# Patient Record
Sex: Female | Born: 1995 | Race: Asian | Hispanic: No | Marital: Single | Smoking: Never smoker
Health system: Southern US, Community
[De-identification: ages and names within clinical notes are randomized; demographics above are authoritative.]

## PROBLEM LIST (undated history)

## (undated) DIAGNOSIS — F419 Anxiety disorder, unspecified: Secondary | ICD-10-CM

## (undated) DIAGNOSIS — R718 Other abnormality of red blood cells: Secondary | ICD-10-CM

## (undated) DIAGNOSIS — D649 Anemia, unspecified: Secondary | ICD-10-CM

## (undated) DIAGNOSIS — F329 Major depressive disorder, single episode, unspecified: Secondary | ICD-10-CM

## (undated) DIAGNOSIS — F32A Depression, unspecified: Secondary | ICD-10-CM

## (undated) HISTORY — PX: CHALAZION EXCISION: SHX213

## (undated) HISTORY — DX: Depression, unspecified: F32.A

## (undated) HISTORY — DX: Anxiety disorder, unspecified: F41.9

## (undated) HISTORY — DX: Other abnormality of red blood cells: R71.8

## (undated) HISTORY — DX: Anemia, unspecified: D64.9

---
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
## 2015-06-15 ENCOUNTER — Emergency Department (HOSPITAL_COMMUNITY)
Admission: EM | Admit: 2015-06-15 | Discharge: 2015-06-15 | Disposition: A | Discharge status: Home / Self Care | Payer: BLUE CROSS/BLUE SHIELD | Attending: Emergency Medicine | Admitting: Emergency Medicine

## 2015-06-15 ENCOUNTER — Encounter (HOSPITAL_COMMUNITY): Payer: Self-pay | Admitting: *Deleted

## 2015-06-15 ENCOUNTER — Emergency Department (HOSPITAL_COMMUNITY): Payer: BLUE CROSS/BLUE SHIELD

## 2015-06-15 DIAGNOSIS — F419 Anxiety disorder, unspecified: Secondary | ICD-10-CM | POA: Insufficient documentation

## 2015-06-15 DIAGNOSIS — R0602 Shortness of breath: Secondary | ICD-10-CM | POA: Diagnosis not present

## 2015-06-15 DIAGNOSIS — Z3202 Encounter for pregnancy test, result negative: Secondary | ICD-10-CM | POA: Insufficient documentation

## 2015-06-15 DIAGNOSIS — R079 Chest pain, unspecified: Secondary | ICD-10-CM | POA: Diagnosis present

## 2015-06-15 LAB — BASIC METABOLIC PANEL
Anion gap: 11 (ref 5–15)
BUN: 10 mg/dL (ref 6–20)
CALCIUM: 9.4 mg/dL (ref 8.9–10.3)
CHLORIDE: 107 mmol/L (ref 101–111)
CO2: 20 mmol/L — ABNORMAL LOW (ref 22–32)
Creatinine, Ser: 0.74 mg/dL (ref 0.44–1.00)
GFR calc Af Amer: >60 mL/min (ref >60)
GFR calc non Af Amer: >60 mL/min (ref >60)
Glucose, Bld: 74 mg/dL (ref 65–99)
Potassium: 3.3 mmol/L — ABNORMAL LOW (ref 3.5–5.1)
Sodium: 138 mmol/L (ref 135–145)

## 2015-06-15 LAB — CBC
HCT: 34.6 % — ABNORMAL LOW (ref 36.0–46.0)
Hemoglobin: 11.1 g/dL — ABNORMAL LOW (ref 12.0–15.0)
MCH: 21.3 pg — ABNORMAL LOW (ref 26.0–34.0)
MCHC: 32.1 g/dL (ref 30.0–36.0)
MCV: 66.4 fL — ABNORMAL LOW (ref 78.0–100.0)
PLATELETS: 141 10*3/uL — AB (ref 150–400)
RBC: 5.21 MIL/uL — ABNORMAL HIGH (ref 3.87–5.11)
RDW: 13.6 % (ref 11.5–15.5)
WBC: 5.2 10*3/uL (ref 4.0–10.5)

## 2015-06-15 LAB — I-STAT TROPONIN, ED: TROPONIN I, POC: 0 ng/mL (ref 0.00–0.08)

## 2015-06-15 LAB — POC URINE PREG, ED: Preg Test, Ur: NEGATIVE

## 2015-06-15 MED ORDER — IOHEXOL 350 MG/ML SOLN
80.0000 mL | Freq: Once | INTRAVENOUS | Status: AC | PRN
  Administered 2015-06-15: 100 mL via INTRAVENOUS

## 2015-06-15 NOTE — Discharge Instructions (Signed)

## 2015-06-15 NOTE — ED Provider Notes (Signed)
CSN: [id number]     Arrival date & time 06/15/15  1821 History   This chart was scribed for non-physician practitioner, [facility]. Damian LeavellNeese, NP, working with Benjiman CoreNathan Pickering, MD, by Budd PalmerVanessa Prueter ED Scribe. This patient was seen in room TR02C/TR02C and the patient's care was started at 8:40 PM    Chief Complaint  Patient presents with  . Chest Pain   Patient is a 19 y.o. female presenting with chest pain. The history is provided by the patient. No language interpreter was used.  Chest Pain Pain location:  L chest and R chest Pain quality: dull and throbbing   Pain radiates to:  Does not radiate Pain severity:  Moderate Onset quality:  Gradual Duration:  5 days Timing:  Constant Progression:  Worsening Chronicity:  New Context: stress   Relieved by:  Nothing Worsened by:  Nothing tried Ineffective treatments:  None tried Associated symptoms: shortness of breath   Risk factors: no smoking    HPI Comments: Debra Burke is a 19 y.o. female who presents to the Emergency Department complaining of dull, throbbing, chest pain onset 4-5 days ago. She reports associated SOB. She says it started on the left, then moved to the right and then to the entire chest 2 days ago. [facility] performed a D-Dimer test that came back elevated, so she was referred to the ED. She does not take any medications. She has been under a lot of stress at school, is working full time, and there has been a recent death of a family friend. She does not smoke. She is on an implant birth control. She denies a family history of DVT or PE.   History reviewed. No pertinent past medical history. History reviewed. No pertinent past surgical history. No family history on file. History  Substance Use Topics  . Smoking status: Never Smoker   . Smokeless tobacco: Not on file  . Alcohol Use: No   OB History    No data available     Review of Systems  Respiratory: Positive for shortness of  breath.   Cardiovascular: Positive for chest pain.  All other systems reviewed and are negative.   Allergies  Review of patient's allergies indicates no known allergies.  Home Medications   Prior to Admission medications   Not on File   BP 110/59 mmHg  Pulse 78  Temp(Src) 97.9 F (36.6 C) (Oral)  Resp 18  Ht 5\' 3"  (1.6 m)  Wt 112 lb (50.803 kg)  BMI 19.84 kg/m2  SpO2 100% Physical Exam  Constitutional: She is oriented to person, place, and time. She appears well-developed and well-nourished. No distress.  HENT:  Head: Normocephalic and atraumatic.  Mouth/Throat: Oropharynx is clear and moist.  Eyes: Conjunctivae and EOM are normal. Pupils are equal, round, and reactive to light.  Neck: Normal range of motion. Neck supple. Normal carotid pulses and no JVD present. No spinous process tenderness present. Carotid bruit is not present. No tracheal deviation present.  Cardiovascular: Normal rate, regular rhythm and normal heart sounds.   No distension of the JVD No bruits  Pulmonary/Chest: Breath sounds normal. No respiratory distress.  Lungs clear to auscultation  Abdominal: Soft.  Musculoskeletal: Normal range of motion.  Neurological: She is alert and oriented to person, place, and time.  Skin: Skin is warm and dry.  Psychiatric: She has a normal mood and affect. Her behavior is normal.  Nursing note and vitals reviewed.   ED  Course  Procedures  DIAGNOSTIC STUDIES: Oxygen Saturation is 99% on RA, normal by my interpretation.    COORDINATION OF CARE: 8:45 PM - Discussed plans to perform a chest CT. Pt advised of plan for treatment and pt agrees.  Labs Review Results for orders placed or performed during the hospital encounter of 06/15/15 (from the past 24 hour(s))  CBC     Status: Abnormal   Collection Time: 06/15/15  6:35 PM  Result Value Ref Range   WBC 5.2 4.0 - 10.5 K/uL   RBC 5.21 (H) 3.87 - 5.11 MIL/uL   Hemoglobin 11.1 (L) 12.0 - 15.0 g/dL   HCT 16.1 (L)  09.6 - 46.0 %   MCV 66.4 (L) 78.0 - 100.0 fL   MCH 21.3 (L) 26.0 - 34.0 pg   MCHC 32.1 30.0 - 36.0 g/dL   RDW 04.5 40.9 - 81.1 %   Platelets 141 (L) 150 - 400 K/uL  Basic metabolic panel     Status: Abnormal   Collection Time: 06/15/15  6:35 PM  Result Value Ref Range   Sodium 138 135 - 145 mmol/L   Potassium 3.3 (L) 3.5 - 5.1 mmol/L   Chloride 107 101 - 111 mmol/L   CO2 20 (L) 22 - 32 mmol/L   Glucose, Bld 74 65 - 99 mg/dL   BUN 10 6 - 20 mg/dL   Creatinine, Ser 9.14 0.44 - 1.00 mg/dL   Calcium 9.4 8.9 - 78.2 mg/dL   GFR calc non Af Amer >60 >60 mL/min   GFR calc Af Amer >60 >60 mL/min   Anion gap 11 5 - 15  I-stat troponin, ED  (not at [facility])     Status: None   Collection Time: 06/15/15  6:54 PM  Result Value Ref Range   Troponin i, poc 0.00 0.00 - 0.08 ng/mL   Comment 3          POC Urine Pregnancy, ED (pre-menopausal females)  not at [facility]     Status: None   Collection Time: 06/15/15  8:25 PM  Result Value Ref Range   Preg Test, Ur NEGATIVE NEGATIVE     Imaging Review Dg Chest 2 View  06/15/2015   CLINICAL DATA:  Chest pain for 3 days with shortness of breath.  EXAM: CHEST  2 VIEW  COMPARISON:  11/10/2005.  FINDINGS: Trachea is midline. Heart size normal. Lungs are hyperinflated but clear. No pleural fluid. Mild S-shaped scoliosis.  IMPRESSION: Hyperinflation without acute finding.   Electronically Signed   By: Leanna Battles M.D.   On: 06/15/2015 18:55   Ct Angio Chest Pe W/cm &/or Wo Cm  06/15/2015   CLINICAL DATA:  Chest pain and shortness of breath for 5 days.  EXAM: CT ANGIOGRAPHY CHEST WITH CONTRAST  TECHNIQUE: Multidetector CT imaging of the chest was performed using the standard protocol during bolus administration of intravenous contrast. Multiplanar CT image reconstructions and MIPs were obtained to evaluate the vascular anatomy.  CONTRAST:  OMNIPAQUE IOHEXOL 350 MG/ML SOLN  COMPARISON:  None.  FINDINGS: Mediastinum/Lymph Nodes: Satisfactory opacification of  pulmonary arteries is noted, and no pulmonary embolism identified. No evidence of thoracic aortic dissection or aneurysm.  No masses or pathologically enlarged lymph nodes identified.  Lungs/Pleura: No pulmonary infiltrate or mass identified. No effusion present.  Musculoskeletal/Soft Tissues: No suspicious bone lesions or other significant chest wall abnormality.  Upper Abdomen:  Unremarkable.  Review of the MIP images confirms the above findings.  IMPRESSION: Negative. No evidence  of pulmonary embolism or other active disease within the thorax.   Electronically Signed   By: Myles Rosenthal M.D.   On: 06/15/2015 22:10  ation:  Sinus tachycardia Rightward axis Nonspecific T wave abnormality Abnormal ECG Confirmed by Rubin Payor  MD, NATHAN [phone]) on 06/15/2015 8:48:46 PM   EKG Interpretation   Date/Time:  Wednesday June 15 2015 18:25:13 EDT Ventricular Rate:  108 PR Interval:  148 QRS Duration: 78 QT Interval:  322 QTC Calculation: 431 R Axis:   95 Text Interpretation:  Sinus tachycardia Rightward axis Nonspecific T wave  abnormality Abnormal ECG Confirmed by Rubin Payor  MD, Harrold Donath [phone]) on  06/15/2015 8:48:46 PM     I discussed this case with Dr. Rubin Payor and reviewed the EKG with him.  MDM  19 y.o. female with chest pain x 4 days and feeling stressed due to work, school and death of a friend. Stable for d/c and currently feeling better without pain or felling anxious. She will return to her doctor for follow up. She will return here as needed. Discussed with the patient clinical CT, x-ray and lab findings and all questioned fully answered.  Final diagnoses:  Chest pain  Anxiety   I personally performed the services described in this documentation, which was scribed in my presence. The recorded information has been reviewed and is accurate.   Willis Wharf, NP 06/16/15 [phone]  Benjiman Core, MD 06/18/15 [phone]

## 2015-06-15 NOTE — ED Notes (Signed)
Pt c/o generalized CP x 4 days.

## 2015-06-15 NOTE — ED Notes (Addendum)
Pt verbalizes understanding of d/c instructions and denies any further needs at this time. 

## 2016-06-04 IMAGING — CT CT ANGIO CHEST
2 of 10 series · 15 of 36 positions shown · IV contrast (Omni 300)
Comparison: None.

CLINICAL DATA: Chest pain and shortness of breath for 5 days.

EXAM:
CT ANGIOGRAPHY CHEST WITH CONTRAST
TECHNIQUE: Multidetector CT imaging of the chest was performed using the
standard protocol during bolus administration of intravenous
contrast. Multiplanar CT image reconstructions and MIPs were
obtained to evaluate the vascular anatomy.
CONTRAST:  100mL OMNIPAQUE IOHEXOL 350 MG/ML SOLN

[Series 8: coronal mpr · coronal · 0.49mm/px · 1 of 88 slices shown]
[im 44/88  mediastinal]
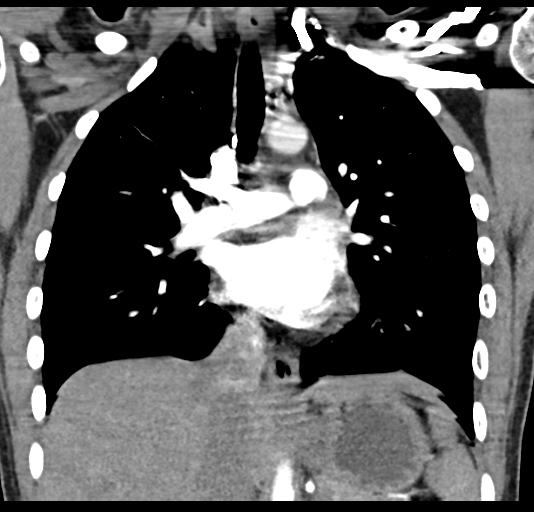

[Series 15: pe 1.0 i30f 3 · axial · 0.53mm/px · z∈[-694,-485]mm · 14 of 243 slices shown]
[im 17/243  lung]
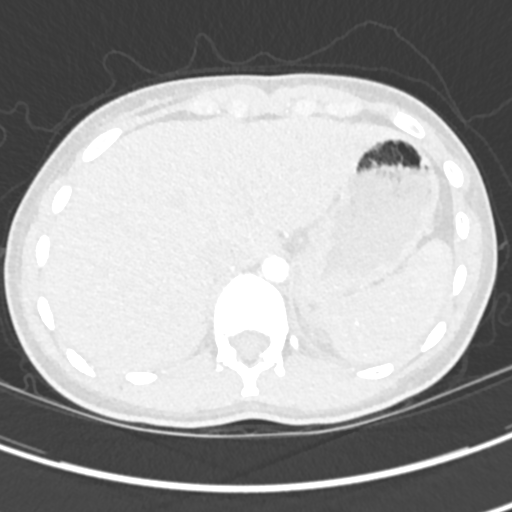
[im 33/243  mediastinal]
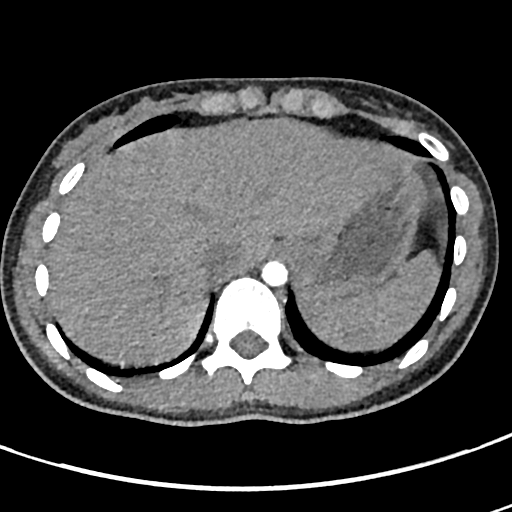
[im 49/243  lung]
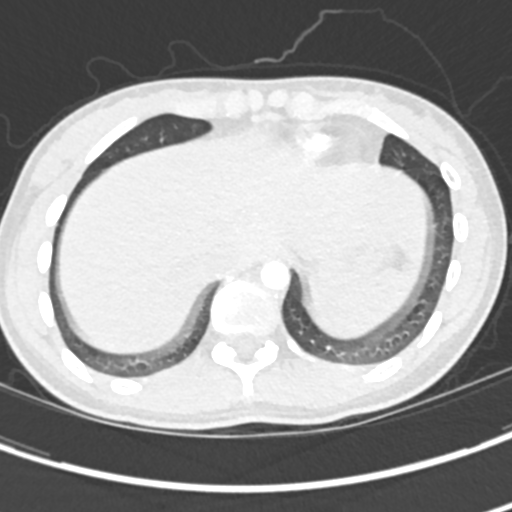
[im 65/243  mediastinal]
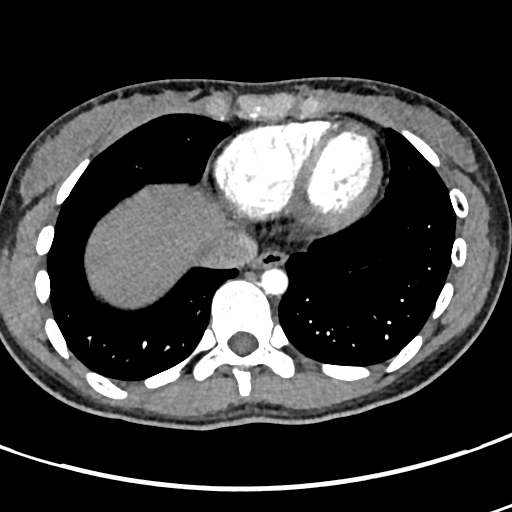
[im 81/243  lung]
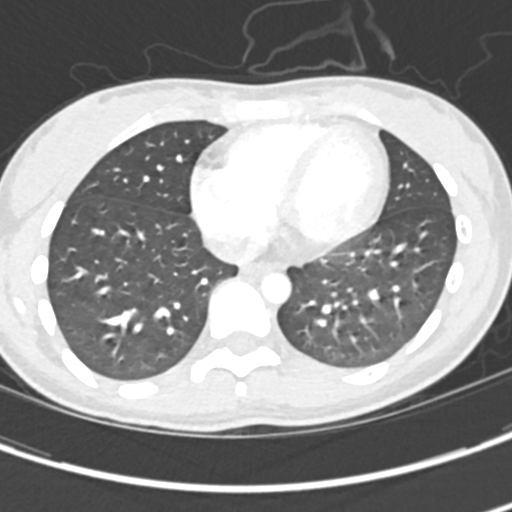
[im 97/243  mediastinal]
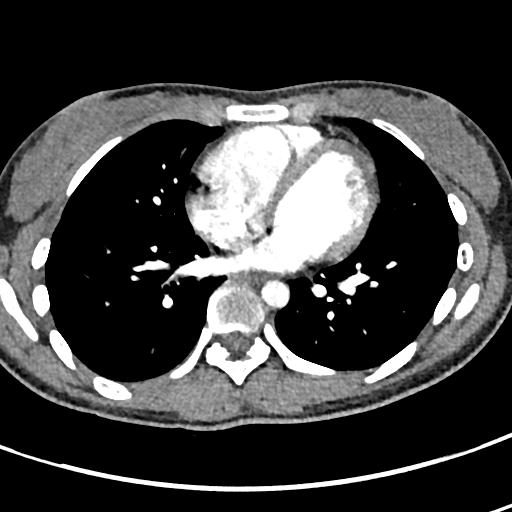
[im 113/243  lung]
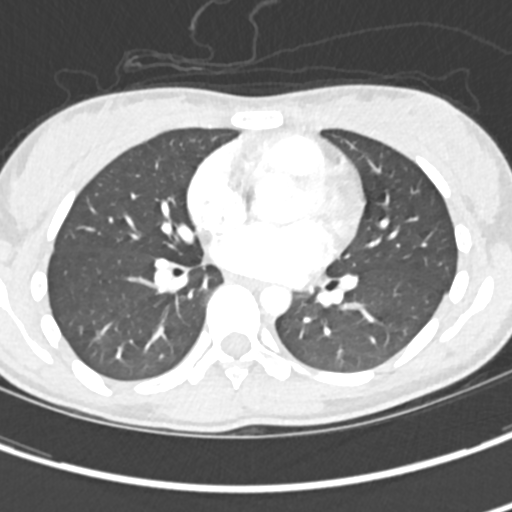
[im 130/243  mediastinal]
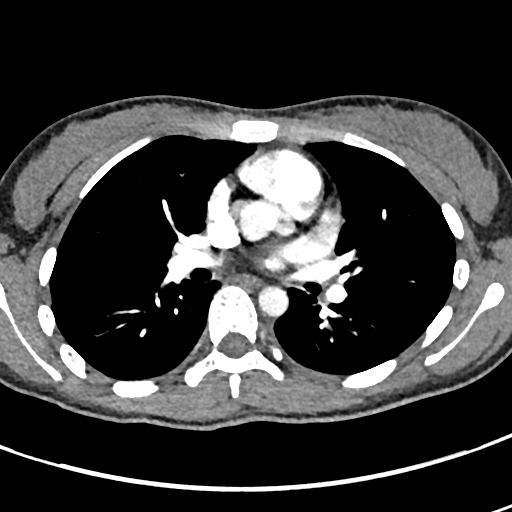
[im 146/243  lung]
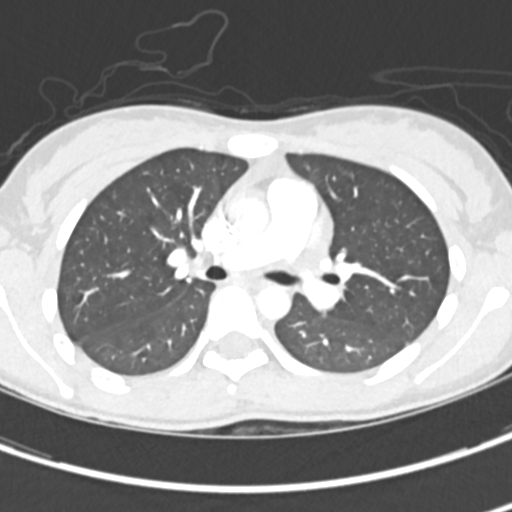
[im 162/243  mediastinal]
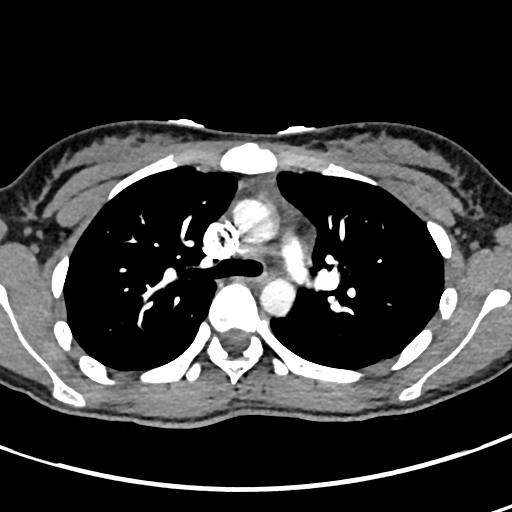
[im 178/243  lung]
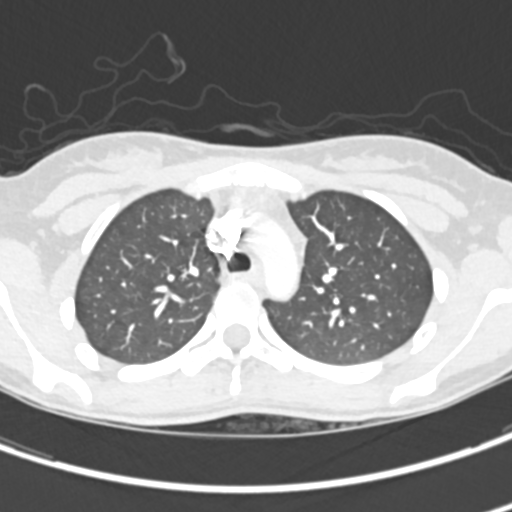
[im 194/243  mediastinal]
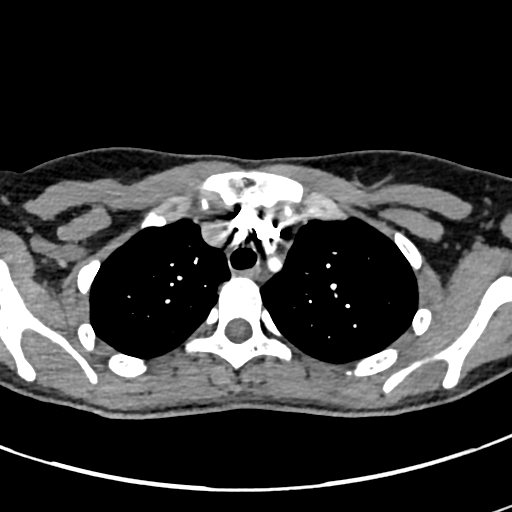
[im 210/243  lung]
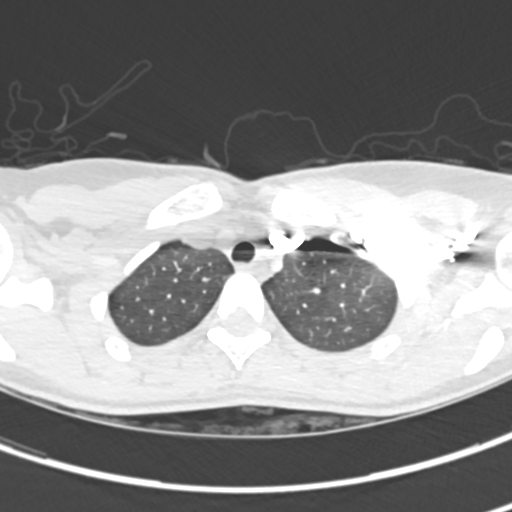
[im 226/243  mediastinal]
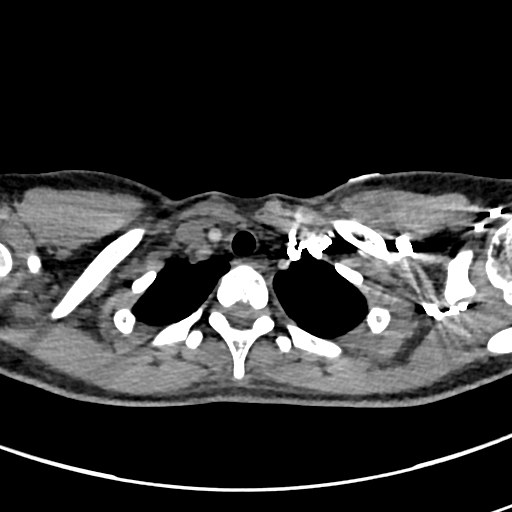

[15 of 36 positions shown; findings below may reference images not displayed]

FINDINGS: Mediastinum/Lymph Nodes: Satisfactory opacification of pulmonary
arteries is noted, and no pulmonary embolism identified. No evidence
of thoracic aortic dissection or aneurysm.

No masses or pathologically enlarged lymph nodes identified.

Lungs/Pleura: No pulmonary infiltrate or mass identified. No
effusion present.

Musculoskeletal/Soft Tissues: No suspicious bone lesions or other
significant chest wall abnormality.

Upper Abdomen:  Unremarkable.

Review of the MIP images confirms the above findings.
IMPRESSION: Negative. No evidence of pulmonary embolism or other active disease
within the thorax.

## 2016-06-04 IMAGING — DX DG CHEST 2V
2 series · 2 of 2 positions shown · non-contrast
Comparison: 11/10/2005.

CLINICAL DATA: Chest pain for 3 days with shortness of breath.

EXAM:
CHEST  2 VIEW

[chest pa]
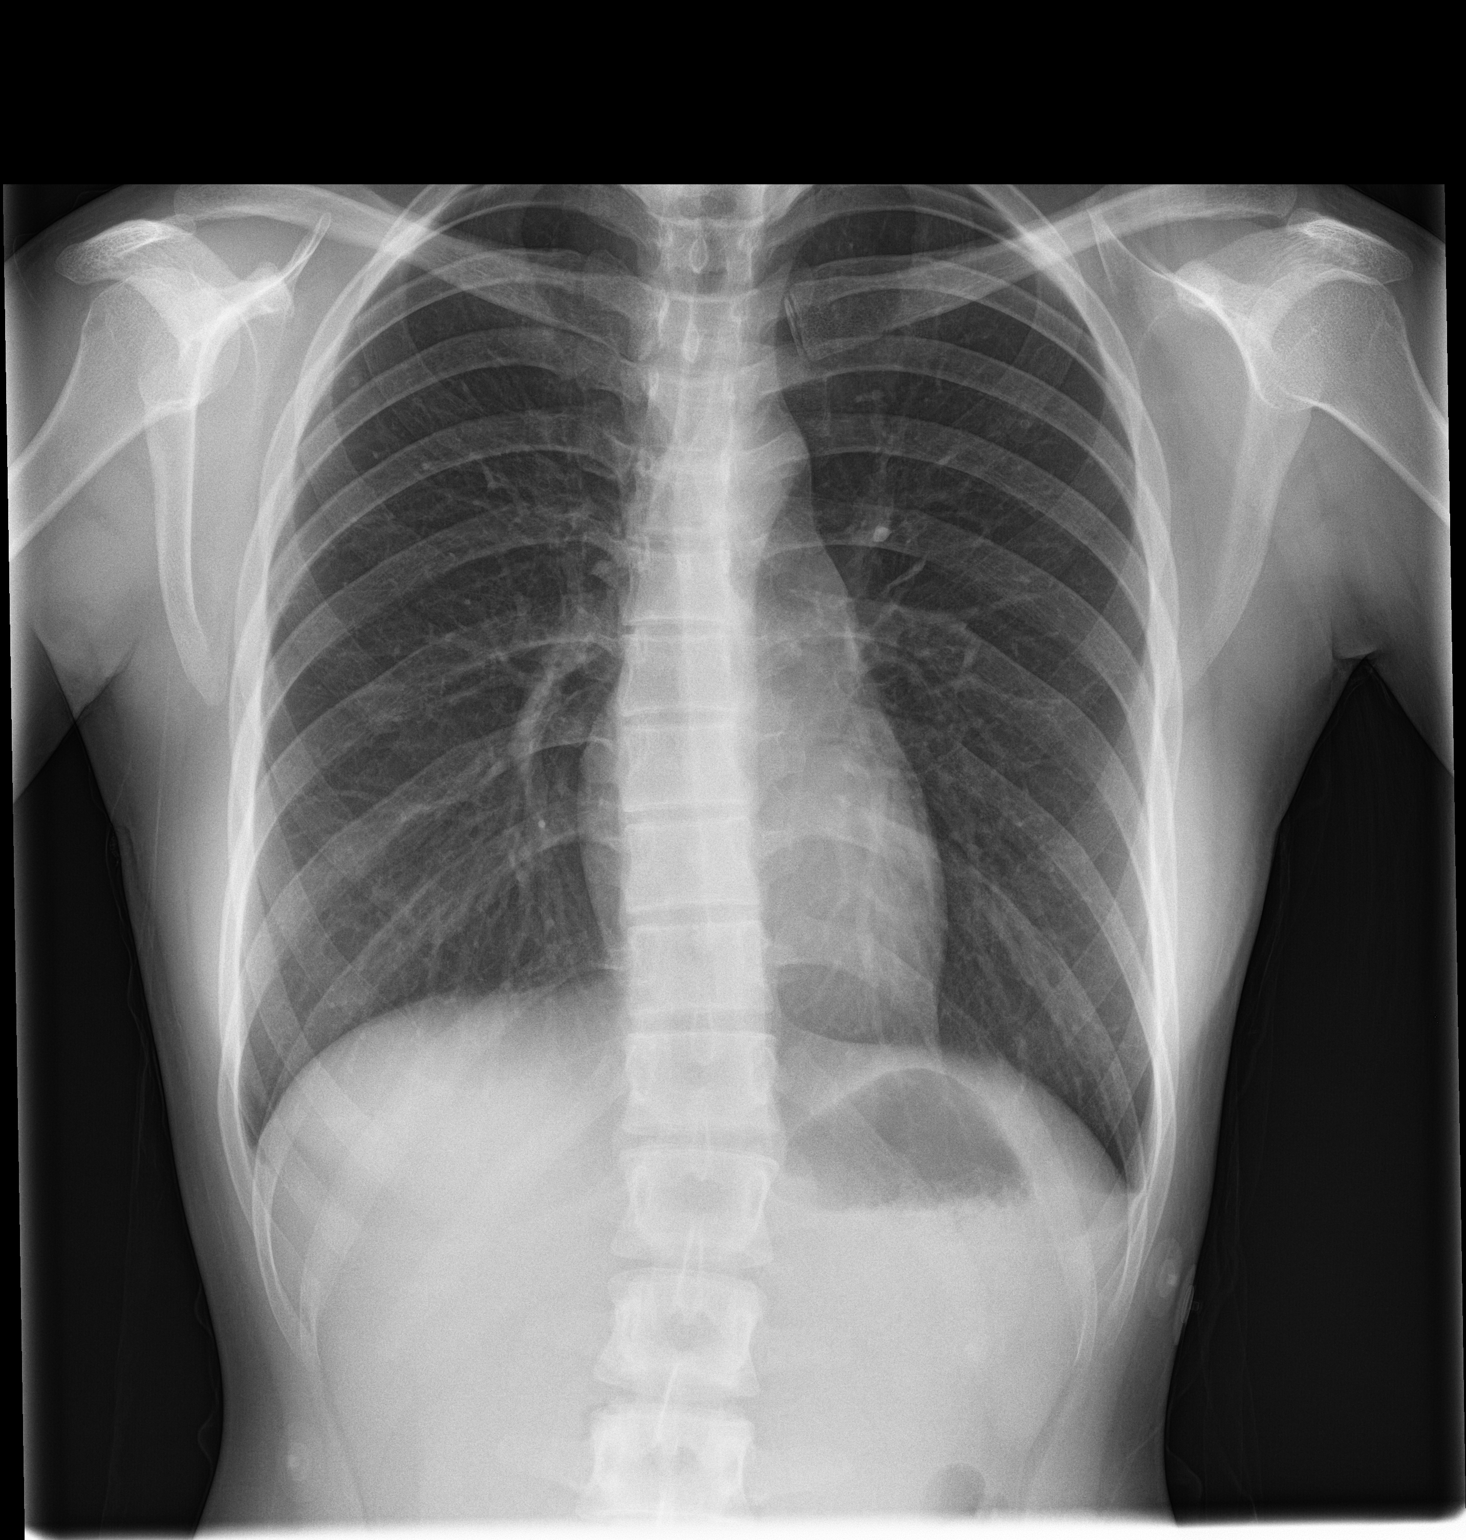

[chest lat]
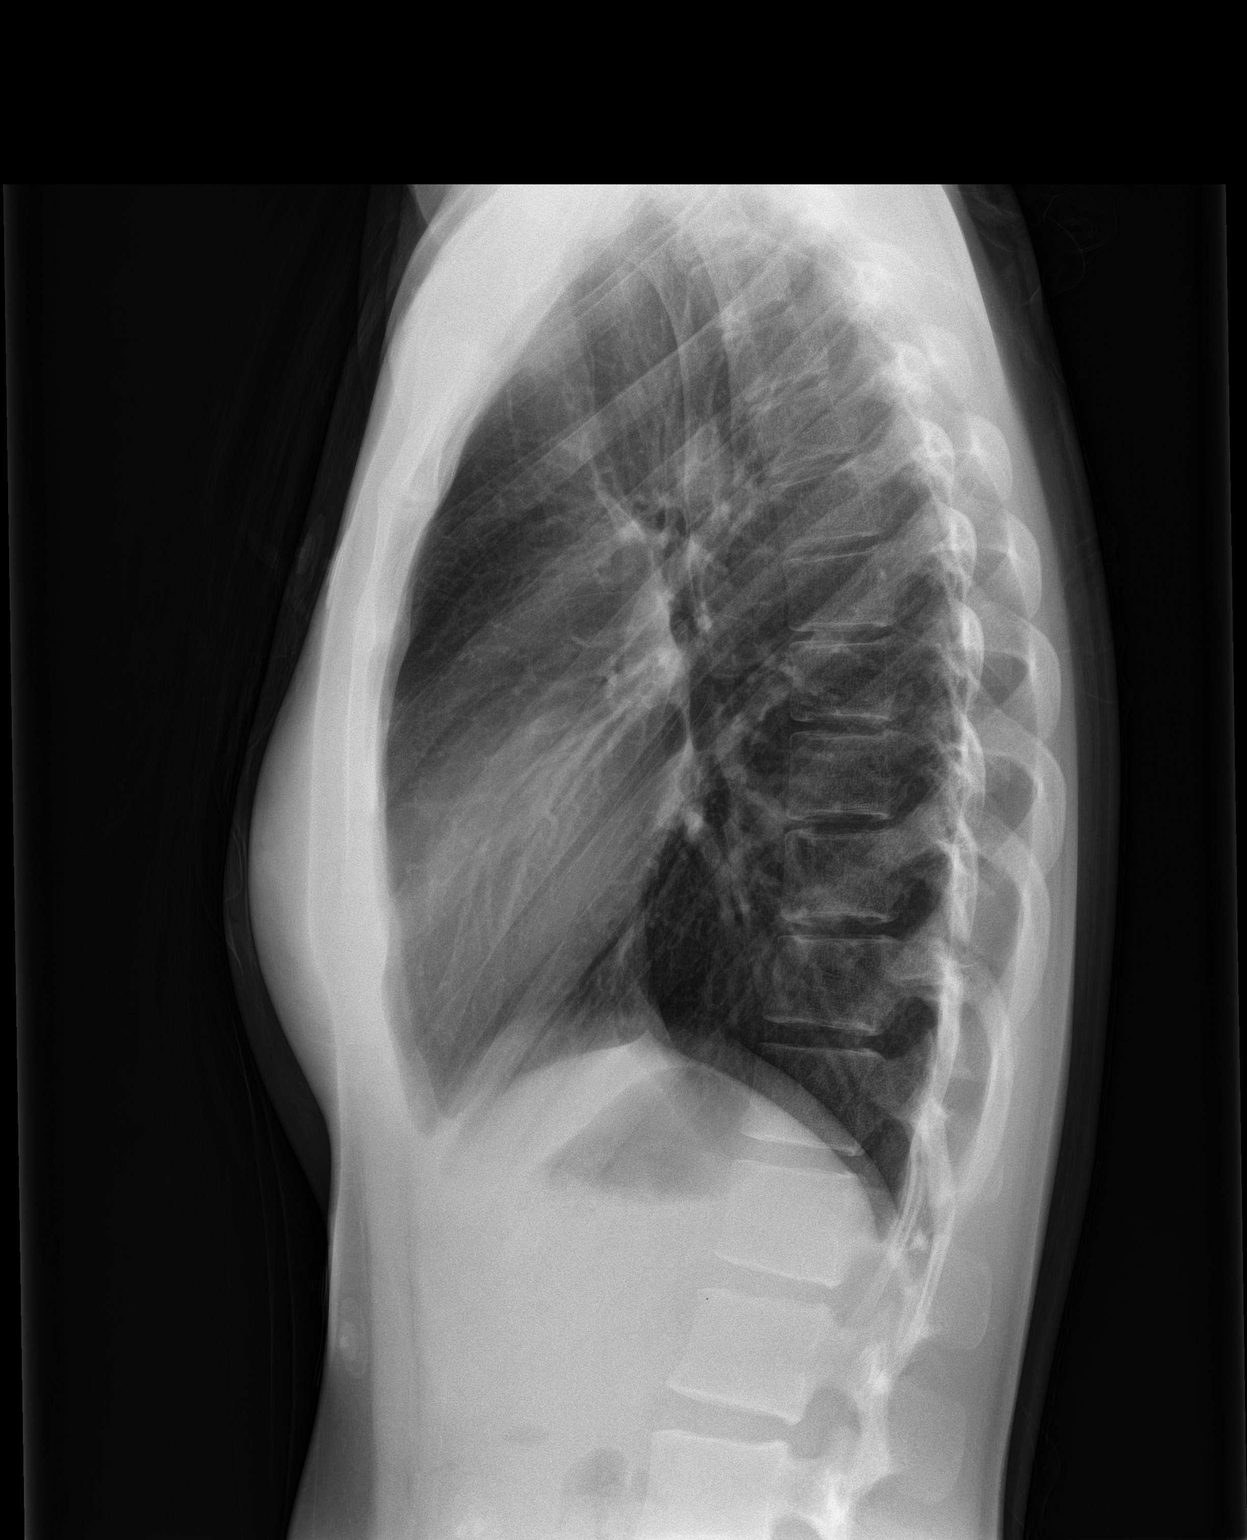

[2 of 2 positions shown; findings below may reference images not displayed]

FINDINGS: Trachea is midline. Heart size normal. Lungs are hyperinflated but
clear. No pleural fluid. Mild S-shaped scoliosis.
IMPRESSION: Hyperinflation without acute finding.

## 2017-09-19 DIAGNOSIS — Z23 Encounter for immunization: Secondary | ICD-10-CM | POA: Diagnosis not present

## 2018-01-14 DIAGNOSIS — H43391 Other vitreous opacities, right eye: Secondary | ICD-10-CM | POA: Diagnosis not present

## 2018-06-05 DIAGNOSIS — Z7189 Other specified counseling: Secondary | ICD-10-CM | POA: Diagnosis not present

## 2018-06-05 DIAGNOSIS — Z113 Encounter for screening for infections with a predominantly sexual mode of transmission: Secondary | ICD-10-CM | POA: Diagnosis not present

## 2018-06-06 DIAGNOSIS — Z01411 Encounter for gynecological examination (general) (routine) with abnormal findings: Secondary | ICD-10-CM | POA: Diagnosis not present

## 2018-06-06 DIAGNOSIS — Z309 Encounter for contraceptive management, unspecified: Secondary | ICD-10-CM | POA: Diagnosis not present

## 2018-06-06 DIAGNOSIS — N899 Noninflammatory disorder of vagina, unspecified: Secondary | ICD-10-CM | POA: Diagnosis not present

## 2018-06-09 DIAGNOSIS — Z01411 Encounter for gynecological examination (general) (routine) with abnormal findings: Secondary | ICD-10-CM | POA: Diagnosis not present

## 2018-06-11 DIAGNOSIS — Z111 Encounter for screening for respiratory tuberculosis: Secondary | ICD-10-CM | POA: Diagnosis not present

## 2018-06-11 DIAGNOSIS — R5383 Other fatigue: Secondary | ICD-10-CM | POA: Diagnosis not present

## 2018-06-13 DIAGNOSIS — R5383 Other fatigue: Secondary | ICD-10-CM | POA: Diagnosis not present

## 2018-09-04 DIAGNOSIS — Z113 Encounter for screening for infections with a predominantly sexual mode of transmission: Secondary | ICD-10-CM | POA: Diagnosis not present

## 2018-09-04 DIAGNOSIS — R5383 Other fatigue: Secondary | ICD-10-CM | POA: Diagnosis not present

## 2018-12-14 DIAGNOSIS — J069 Acute upper respiratory infection, unspecified: Secondary | ICD-10-CM | POA: Diagnosis not present

## 2018-12-14 DIAGNOSIS — J029 Acute pharyngitis, unspecified: Secondary | ICD-10-CM | POA: Diagnosis not present

## 2018-12-23 DIAGNOSIS — S161XXA Strain of muscle, fascia and tendon at neck level, initial encounter: Secondary | ICD-10-CM | POA: Diagnosis not present

## 2018-12-23 DIAGNOSIS — M419 Scoliosis, unspecified: Secondary | ICD-10-CM | POA: Diagnosis not present

## 2018-12-23 DIAGNOSIS — M25511 Pain in right shoulder: Secondary | ICD-10-CM | POA: Diagnosis not present

## 2019-05-11 ENCOUNTER — Ambulatory Visit: Payer: BLUE CROSS/BLUE SHIELD | Admitting: Obstetrics and Gynecology

## 2019-05-11 ENCOUNTER — Encounter: Payer: Self-pay | Admitting: Obstetrics and Gynecology

## 2019-05-11 ENCOUNTER — Other Ambulatory Visit: Payer: Self-pay

## 2019-05-11 ENCOUNTER — Other Ambulatory Visit (HOSPITAL_COMMUNITY)
Admission: RE | Admit: 2019-05-11 | Discharge: 2019-05-11 | Disposition: A | Discharge status: Home / Self Care | Payer: BC Managed Care – PPO | Source: Ambulatory Visit | Attending: Obstetrics and Gynecology | Admitting: Obstetrics and Gynecology

## 2019-05-11 VITALS — BP 114/70 | HR 88 | Temp 97.9°F | Resp 14 | Ht 62.0 in | Wt 124.0 lb

## 2019-05-11 DIAGNOSIS — Z01419 Encounter for gynecological examination (general) (routine) without abnormal findings: Secondary | ICD-10-CM

## 2019-05-11 DIAGNOSIS — Z113 Encounter for screening for infections with a predominantly sexual mode of transmission: Secondary | ICD-10-CM

## 2019-05-11 DIAGNOSIS — N760 Acute vaginitis: Secondary | ICD-10-CM

## 2019-05-11 MED ORDER — FLUCONAZOLE 150 MG PO TABS: 150.0000 mg | ORAL_TABLET | Freq: Every day | ORAL | 0 refills | Status: DC

## 2019-05-11 MED ORDER — NORETHINDRONE 0.35 MG PO TABS: 1.0000 | ORAL_TABLET | Freq: Every day | ORAL | 3 refills | Status: DC

## 2019-05-11 NOTE — Patient Instructions (Signed)

## 2019-05-11 NOTE — Progress Notes (Signed)
23 y.o. G46P0010 Single Asian female here as a new patient self referral for an annual exam. Patient states that she has had recurrent yeast infections. Currently has some vulvar itching and white, thick discharge  Had an abnormal pap last year, and she was told she needs a follow up.   Having yeast infections lately.  Notices white creamy discharge.  Has used Monistat in the past.  She has also had some odor and itchiness.  No burning.   Has not seen a provider for her vaginitis.   No recent abx or medication.  Not sexually active currently.  Tried Nexplanon, ortho Evra, COCs, NuvaRing, and Depo Provera.  She has been off since October.   Menses about every 33 days.  Can go up to 40 days.  Has not been sooner than 21 days.  Does not like irregular cycles.   Just graduated from from nursing school.  Will start working at [employer] in August.  PCP: No PCP    Patient's last menstrual period was 04/27/2019 (within days).     Period Cycle (Days): (irregular) Period Duration (Days): 3 Period Pattern: (!) Irregular Menstrual Flow: Moderate, Light Menstrual Control: Tampon, Thin pad Menstrual Control Change Freq (Hours): 3 Dysmenorrhea: (!) Mild Dysmenorrhea Symptoms: Cramping, Throbbing, Nausea     Sexually active: No.  The current method of family planning is abstinence.    Exercising: Yes.    weight lifting and cardio Smoker:  no  Health Maintenance: Pap:  2019 at [facility] -- abnormal per patient History of abnormal Pap:  yes TDaP:  2013 Gardasil:   Yes, completed series HIV: negative in the past Hep C: negative in the past Screening Labs:  Discuss if needed   reports that she has never smoked. She has never used smokeless tobacco. She reports current alcohol use. She reports that she does not use drugs.  Past Medical History:  Diagnosis Date  . Anemia   . Anxiety   . Depression     Past Surgical History:  Procedure Laterality Date  . CHALAZION  EXCISION      No current outpatient medications on file.   No current facility-administered medications for this visit.     Family History  Problem Relation Age of Onset  . Colon cancer Mother   . Hypertension Mother   . Prostate cancer Father   . Hypertension Father   . Gout Father   . Anemia Sister   . Epilepsy Brother   . Anemia Brother   . Hepatitis B Sister   . Anemia Sister   . Anemia Sister   . Anemia Brother     Review of Systems  Constitutional: Negative.   HENT: Negative.   Eyes: Negative.   Respiratory: Negative.   Cardiovascular: Negative.   Gastrointestinal: Negative.   Endocrine: Negative.   Genitourinary: Positive for vaginal discharge.       Vulvar itching  Musculoskeletal: Negative.   Skin: Negative.   Allergic/Immunologic: Negative.   Neurological: Negative.   Hematological: Negative.   Psychiatric/Behavioral: Negative.     Exam:   BP 114/70 (BP Location: Left Arm, Patient Position: Sitting, Cuff Size: Normal)   Pulse 88   Temp 97.9 F (36.6 C) (Temporal)   Resp 14   Ht 5\' 2"  (1.575 m)   Wt 124 lb (56.2 kg)   LMP 04/27/2019 (Within Days)   BMI 22.68 kg/m     General appearance: alert, cooperative and appears stated age Head: normocephalic, without obvious abnormality, atraumatic  Neck: no adenopathy, supple, symmetrical, trachea midline and thyroid normal to inspection and palpation Lungs: clear to auscultation bilaterally Breasts: normal appearance, no masses or tenderness, No nipple retraction or dimpling, No nipple discharge or bleeding, No axillary adenopathy Heart: regular rate and rhythm Abdomen: soft, non-tender; no masses, no organomegaly Extremities: extremities normal, atraumatic, no cyanosis or edema Skin: skin color, texture, turgor normal. No rashes or lesions Lymph nodes: cervical, supraclavicular, and axillary nodes normal. Neurologic: grossly normal  Pelvic: External genitalia:  no lesions              No abnormal  inguinal nodes palpated.              Urethra:  normal appearing urethra with no masses, tenderness or lesions              Bartholins and Skenes: normal                 Vagina: normal appearing vagina with normal color and discharge, no lesions              Cervix: no lesions              Pap taken: Yes.   Bimanual Exam:  Uterus:  normal size, contour, position, consistency, mobility, non-tender              Adnexa: no mass, fullness, tenderness                 Chaperone was present for exam.  Assessment:   Well woman visit with normal exam. Vaginitis.  Looks classic for yeast.   Plan: Mammogram screening discussed. Self breast awareness reviewed. Pap and HR HPV as above. Guidelines for Calcium, Vitamin D, regular exercise program including cardiovascular and weight bearing exercise. Rx for Micronor.  Instructed in use.  Diflucan Rx.  Affirm.  Routine labs and STD screening.  Follow up annually and prn.   After visit summary provided.

## 2019-05-12 LAB — VAGINITIS/VAGINOSIS, DNA PROBE
Candida Species: POSITIVE — AB
Gardnerella vaginalis: POSITIVE — AB
Trichomonas vaginosis: NEGATIVE

## 2019-05-12 LAB — CBC
Hematocrit: 39.8 % (ref 34.0–46.6)
Hemoglobin: 11.7 g/dL (ref 11.1–15.9)
MCH: 21.5 pg — ABNORMAL LOW (ref 26.6–33.0)
MCHC: 29.4 g/dL — ABNORMAL LOW (ref 31.5–35.7)
MCV: 73 fL — ABNORMAL LOW (ref 79–97)
Platelets: 249 10*3/uL (ref 150–450)
RBC: 5.43 x10E6/uL — ABNORMAL HIGH (ref 3.77–5.28)
RDW: 14.3 % (ref 11.7–15.4)
WBC: 6.3 10*3/uL (ref 3.4–10.8)

## 2019-05-12 LAB — COMPREHENSIVE METABOLIC PANEL
ALT: 12 IU/L (ref 0–32)
AST: 12 IU/L (ref 0–40)
Albumin/Globulin Ratio: 1.3 (ref 1.2–2.2)
Albumin: 4.2 g/dL (ref 3.9–5.0)
Alkaline Phosphatase: 58 IU/L (ref 39–117)
BUN/Creatinine Ratio: 12 (ref 9–23)
BUN: 9 mg/dL (ref 6–20)
Bilirubin Total: 0.3 mg/dL (ref 0.0–1.2)
CO2: 26 mmol/L (ref 20–29)
Calcium: 9.5 mg/dL (ref 8.7–10.2)
Chloride: 101 mmol/L (ref 96–106)
Creatinine, Ser: 0.76 mg/dL (ref 0.57–1.00)
GFR calc Af Amer: 127 mL/min/{1.73_m2} (ref >59)
GFR calc non Af Amer: 110 mL/min/{1.73_m2} (ref >59)
Globulin, Total: 3.2 g/dL (ref 1.5–4.5)
Glucose: 87 mg/dL (ref 65–99)
Potassium: 4 mmol/L (ref 3.5–5.2)
Sodium: 140 mmol/L (ref 134–144)
Total Protein: 7.4 g/dL (ref 6.0–8.5)

## 2019-05-12 LAB — LIPID PANEL
Chol/HDL Ratio: 2.1 ratio (ref 0.0–4.4)
Cholesterol, Total: 124 mg/dL (ref 100–199)
HDL: 59 mg/dL (ref >39)
LDL Calculated: 55 mg/dL (ref 0–99)
Triglycerides: 50 mg/dL (ref 0–149)
VLDL Cholesterol Cal: 10 mg/dL (ref 5–40)

## 2019-05-12 LAB — HEP, RPR, HIV PANEL
HIV Screen 4th Generation wRfx: NONREACTIVE
Hepatitis B Surface Ag: NEGATIVE
RPR Ser Ql: NONREACTIVE

## 2019-05-12 LAB — HEPATITIS C ANTIBODY: Hep C Virus Ab: <0.1 s/co ratio (ref 0.0–0.9)

## 2019-05-13 LAB — CYTOLOGY - PAP
Chlamydia: NEGATIVE
Diagnosis: NEGATIVE
Neisseria Gonorrhea: NEGATIVE

## 2019-05-13 MED ORDER — METRONIDAZOLE 500 MG PO TABS: 500.0000 mg | ORAL_TABLET | Freq: Two times a day (BID) | ORAL | 0 refills | Status: DC

## 2019-05-13 NOTE — Addendum Note (Signed)
Addended by: Ardell Isaacs, Debbe Bales E on: 05/13/2019 11:38 AM   Modules accepted: Orders

## 2019-05-14 LAB — FERRITIN: Ferritin: 47 ng/mL (ref 15–150)

## 2019-05-14 LAB — IRON: Iron: 118 ug/dL (ref 27–159)

## 2019-05-14 LAB — SPECIMEN STATUS REPORT

## 2019-05-19 DIAGNOSIS — F3170 Bipolar disorder, currently in remission, most recent episode unspecified: Secondary | ICD-10-CM | POA: Diagnosis not present

## 2019-05-21 DIAGNOSIS — F410 Panic disorder [episodic paroxysmal anxiety] without agoraphobia: Secondary | ICD-10-CM | POA: Diagnosis not present

## 2019-05-21 DIAGNOSIS — F411 Generalized anxiety disorder: Secondary | ICD-10-CM | POA: Diagnosis not present

## 2019-05-21 DIAGNOSIS — F3340 Major depressive disorder, recurrent, in remission, unspecified: Secondary | ICD-10-CM | POA: Diagnosis not present

## 2019-05-22 ENCOUNTER — Encounter: Payer: Self-pay | Admitting: Obstetrics and Gynecology

## 2019-06-10 DIAGNOSIS — F411 Generalized anxiety disorder: Secondary | ICD-10-CM | POA: Diagnosis not present

## 2019-06-10 DIAGNOSIS — F3340 Major depressive disorder, recurrent, in remission, unspecified: Secondary | ICD-10-CM | POA: Diagnosis not present

## 2019-06-10 DIAGNOSIS — F410 Panic disorder [episodic paroxysmal anxiety] without agoraphobia: Secondary | ICD-10-CM | POA: Diagnosis not present

## 2019-06-18 DIAGNOSIS — F411 Generalized anxiety disorder: Secondary | ICD-10-CM | POA: Diagnosis not present

## 2019-06-18 DIAGNOSIS — F410 Panic disorder [episodic paroxysmal anxiety] without agoraphobia: Secondary | ICD-10-CM | POA: Diagnosis not present

## 2019-06-18 DIAGNOSIS — F3340 Major depressive disorder, recurrent, in remission, unspecified: Secondary | ICD-10-CM | POA: Diagnosis not present

## 2019-06-25 DIAGNOSIS — F411 Generalized anxiety disorder: Secondary | ICD-10-CM | POA: Diagnosis not present

## 2019-06-25 DIAGNOSIS — F410 Panic disorder [episodic paroxysmal anxiety] without agoraphobia: Secondary | ICD-10-CM | POA: Diagnosis not present

## 2019-06-25 DIAGNOSIS — F3340 Major depressive disorder, recurrent, in remission, unspecified: Secondary | ICD-10-CM | POA: Diagnosis not present

## 2019-07-06 DIAGNOSIS — F411 Generalized anxiety disorder: Secondary | ICD-10-CM | POA: Diagnosis not present

## 2019-07-06 DIAGNOSIS — F410 Panic disorder [episodic paroxysmal anxiety] without agoraphobia: Secondary | ICD-10-CM | POA: Diagnosis not present

## 2019-07-06 DIAGNOSIS — F3340 Major depressive disorder, recurrent, in remission, unspecified: Secondary | ICD-10-CM | POA: Diagnosis not present

## 2019-08-11 DIAGNOSIS — A64 Unspecified sexually transmitted disease: Secondary | ICD-10-CM

## 2019-08-11 HISTORY — DX: Unspecified sexually transmitted disease: A64

## 2020-05-11 ENCOUNTER — Other Ambulatory Visit: Payer: Self-pay

## 2020-05-12 ENCOUNTER — Encounter: Payer: Self-pay | Admitting: Obstetrics and Gynecology

## 2020-05-12 ENCOUNTER — Ambulatory Visit: Payer: Managed Care, Other (non HMO) | Admitting: Obstetrics and Gynecology

## 2020-05-12 ENCOUNTER — Other Ambulatory Visit (HOSPITAL_COMMUNITY)
Admission: RE | Admit: 2020-05-12 | Discharge: 2020-05-12 | Disposition: A | Discharge status: Home / Self Care | Payer: Managed Care, Other (non HMO) | Source: Ambulatory Visit | Attending: Obstetrics and Gynecology | Admitting: Obstetrics and Gynecology

## 2020-05-12 VITALS — BP 120/74 | HR 80 | Temp 97.6°F | Resp 14 | Ht 63.0 in | Wt 129.4 lb

## 2020-05-12 DIAGNOSIS — Z01419 Encounter for gynecological examination (general) (routine) without abnormal findings: Secondary | ICD-10-CM

## 2020-05-12 DIAGNOSIS — Z113 Encounter for screening for infections with a predominantly sexual mode of transmission: Secondary | ICD-10-CM | POA: Diagnosis present

## 2020-05-12 NOTE — Progress Notes (Signed)
24 y.o. G72P0010 Single Asian female here for annual exam.    Treated for positive chlamydia 08/2019 but not sure of had TOC. She had discharge and fever and fever.  She received an injection and Doxycycline and possibly Flagyl.   She prefers not to take hormonal contraception.   Working as Marine scientist for [employer]. Received her Covid vaccine.   PCP: None    Patient's last menstrual period was 05/02/2020 (exact date).     Period Pattern: (!) Irregular     Sexually active: Yes.    The current method of family planning is condoms most of the time.    Exercising: No.  some hiking Smoker:  no  Health Maintenance: Pap: 05-11-19 Neg, 2019 at [facility] -- abnormal per patient History of abnormal Pap:  Yes, pt. Though she had abnormal pap in 2019 at [place]:  n/a Colonoscopy:  n/a BMD:   n/a  Result  n/a TDaP:  2013 Gardasil:   yes HIV: 05-11-19 NR Hep C: 05-11-19 Neg Screening Labs:   Today.   reports that she has never smoked. She has never used smokeless tobacco. She reports current alcohol use. She reports that she does not use drugs.  Past Medical History:  Diagnosis Date  . Anemia   . Anxiety   . Depression   . Low mean corpuscular volume (MCV)    normal iron studies  . STD (sexually transmitted disease) 08/2019   treated for chlamydia    Past Surgical History:  Procedure Laterality Date  . CHALAZION EXCISION      Current Outpatient Medications  Medication Sig Dispense Refill  . Desvenlafaxine ER (PRISTIQ) 50 MG TB24 Take 1 tablet by mouth daily.    Marland Kitchen gabapentin (NEURONTIN) 300 MG capsule Take 300 mg by mouth 3 (three) times daily as needed.    Marland Kitchen LORazepam (ATIVAN) 0.5 MG tablet Take 1 tablet by mouth as needed.    Marland Kitchen OVER THE COUNTER MEDICATION Ashwaganda: 1 tablet as needed at night    . Rhodiola rosea (RHODIOLA PO) Take 1 tablet by mouth daily.     No current facility-administered medications for this visit.    Family History  Problem  Relation Age of Onset  . Colon cancer Mother   . Hypertension Mother   . Cancer Mother        colon cancer in her 86s.  . Prostate cancer Father   . Hypertension Father   . Gout Father   . Anemia Sister   . Epilepsy Brother   . Anemia Brother   . Hepatitis B Sister   . Anemia Sister   . Anemia Sister   . Anemia Brother     Review of Systems  All other systems reviewed and are negative.   Exam:   BP 120/74   Pulse 80   Temp 97.6 F (36.4 C) (Temporal)   Resp 14   Ht 5\' 3"  (1.6 m)   Wt 129 lb 6.4 oz (58.7 kg)   LMP 05/02/2020 (Exact Date)   BMI 22.92 kg/m     General appearance: alert, cooperative and appears stated age Head: normocephalic, without obvious abnormality, atraumatic Neck: no adenopathy, supple, symmetrical, trachea midline and thyroid normal to inspection and palpation Lungs: clear to auscultation bilaterally Breasts: normal appearance, no masses or tenderness, No nipple retraction or dimpling, No nipple discharge or bleeding, No axillary adenopathy Heart: regular rate and rhythm Abdomen: soft, non-tender; no masses, no organomegaly Extremities: extremities normal, atraumatic,  no cyanosis or edema Skin: skin color, texture, turgor normal. No rashes or lesions Lymph nodes: cervical, supraclavicular, and axillary nodes normal. Neurologic: grossly normal  Pelvic: External genitalia:  no lesions              No abnormal inguinal nodes palpated.              Urethra:  normal appearing urethra with no masses, tenderness or lesions              Bartholins and Skenes: normal                 Vagina: normal appearing vagina with normal color and discharge, no lesions              Cervix: no lesions              Pap taken: Yes.   Bimanual Exam:  Uterus:  normal size, contour, position, consistency, mobility, non-tender              Adnexa: no mass, fullness, tenderness         Chaperone was present for exam.  Assessment:   Well woman visit with normal  exam. Chlamydia in 2020.  ?PID FH colon cancer in mother in her 66s.   Plan: Mammogram screening discussed. Self breast awareness reviewed. Pap and HR HPV as above. Guidelines for Calcium, Vitamin D, regular exercise program including cardiovascular and weight bearing exercise. STD screening and routine labs. We discussed increased risk of ectopic pregnancy with a hx of chlamydia and/or PID. She knows she will need early hCG monitoring with pregnancy. Patient will need to start colon cancer screening in her 30s.  She will ask her mother if she had genetic testing.  We discussed ParaGard IUD, diaphragm, and condom use for pregnancy prevention.  Follow up annually and prn.   After visit summary provided.

## 2020-05-12 NOTE — Patient Instructions (Signed)

## 2020-05-13 LAB — COMPREHENSIVE METABOLIC PANEL
ALT: 29 IU/L (ref 0–32)
AST: 18 IU/L (ref 0–40)
Albumin/Globulin Ratio: 1.5 (ref 1.2–2.2)
Albumin: 4.2 g/dL (ref 3.9–5.0)
Alkaline Phosphatase: 63 IU/L (ref 48–121)
BUN/Creatinine Ratio: 16 (ref 9–23)
BUN: 12 mg/dL (ref 6–20)
Bilirubin Total: <0.2 mg/dL (ref 0.0–1.2)
CO2: 24 mmol/L (ref 20–29)
Calcium: 9.1 mg/dL (ref 8.7–10.2)
Chloride: 102 mmol/L (ref 96–106)
Creatinine, Ser: 0.75 mg/dL (ref 0.57–1.00)
GFR calc Af Amer: 128 mL/min/{1.73_m2} (ref >59)
GFR calc non Af Amer: 111 mL/min/{1.73_m2} (ref >59)
Globulin, Total: 2.8 g/dL (ref 1.5–4.5)
Glucose: 66 mg/dL (ref 65–99)
Potassium: 4.3 mmol/L (ref 3.5–5.2)
Sodium: 141 mmol/L (ref 134–144)
Total Protein: 7 g/dL (ref 6.0–8.5)

## 2020-05-13 LAB — CBC
Hematocrit: 37.5 % (ref 34.0–46.6)
Hemoglobin: 11.3 g/dL (ref 11.1–15.9)
MCH: 21.8 pg — ABNORMAL LOW (ref 26.6–33.0)
MCHC: 30.1 g/dL — ABNORMAL LOW (ref 31.5–35.7)
MCV: 72 fL — ABNORMAL LOW (ref 79–97)
Platelets: 285 10*3/uL (ref 150–450)
RBC: 5.19 x10E6/uL (ref 3.77–5.28)
RDW: 13.9 % (ref 11.7–15.4)
WBC: 8.1 10*3/uL (ref 3.4–10.8)

## 2020-05-13 LAB — CYTOLOGY - PAP
Chlamydia: NEGATIVE
Comment: NEGATIVE
Comment: NEGATIVE
Comment: NEGATIVE
Comment: NORMAL
Diagnosis: NEGATIVE
High risk HPV: NEGATIVE
Neisseria Gonorrhea: NEGATIVE
Trichomonas: NEGATIVE

## 2020-05-13 LAB — HEP, RPR, HIV PANEL
HIV Screen 4th Generation wRfx: NONREACTIVE
Hepatitis B Surface Ag: NEGATIVE
RPR Ser Ql: NONREACTIVE

## 2020-05-13 LAB — LIPID PANEL
Chol/HDL Ratio: 2.4 ratio (ref 0.0–4.4)
Cholesterol, Total: 145 mg/dL (ref 100–199)
HDL: 61 mg/dL (ref >39)
LDL Chol Calc (NIH): 70 mg/dL (ref 0–99)
Triglycerides: 70 mg/dL (ref 0–149)
VLDL Cholesterol Cal: 14 mg/dL (ref 5–40)

## 2020-05-13 LAB — HEPATITIS C ANTIBODY: Hep C Virus Ab: <0.1 s/co ratio (ref 0.0–0.9)

## 2021-05-01 NOTE — Progress Notes (Deleted)
25 y.o. G1P0010 Single Asian female here for annual exam.    PCP:   None  No LMP recorded.           Sexually active: Yes. The current method of family planning is condoms. Exercising: No.  Smoker:  No.  Health Maintenance: Pap: 05/12/2020 Neg, HR HPV Neg. 05/11/2019 Neg at [facility] History of abnormal Pap: Yes, pt. Though she had abnormal pap in 2019 at [facility] health MMG: n/a TDaP: 2013 Gardasil:  Yes, HIV:  05/11/2019 NR Hep C: 05/11/2019 Neg Screening Labs:  Hb today: ***, Urine today: ***   reports that she has never smoked. She has never used smokeless tobacco. She reports current alcohol use. She reports that she does not use drugs.  Past Medical History:  Diagnosis Date  . Anemia   . Anxiety   . Depression   . Low mean corpuscular volume (MCV)    normal iron studies  . STD (sexually transmitted disease) 08/2019   treated for chlamydia    Past Surgical History:  Procedure Laterality Date  . CHALAZION EXCISION      Current Outpatient Medications  Medication Sig Dispense Refill  . Desvenlafaxine ER (PRISTIQ) 50 MG TB24 Take 1 tablet by mouth daily.    Marland Kitchen gabapentin (NEURONTIN) 300 MG capsule Take 300 mg by mouth 3 (three) times daily as needed.    Marland Kitchen LORazepam (ATIVAN) 0.5 MG tablet Take 1 tablet by mouth as needed.    Marland Kitchen OVER THE COUNTER MEDICATION Ashwaganda: 1 tablet as needed at night    . Rhodiola rosea (RHODIOLA PO) Take 1 tablet by mouth daily.     No current facility-administered medications for this visit.    Family History  Problem Relation Age of Onset  . Colon cancer Mother   . Hypertension Mother   . Cancer Mother        colon cancer in her 19s.  . Prostate cancer Father   . Hypertension Father   . Gout Father   . Anemia Sister   . Epilepsy Brother   . Anemia Brother   . Hepatitis B Sister   . Anemia Sister   . Anemia Sister   . Anemia Brother     Review of Systems  Exam:   There were no vitals taken for this visit.    General  appearance: alert, cooperative and appears stated age Head: normocephalic, without obvious abnormality, atraumatic Neck: no adenopathy, supple, symmetrical, trachea midline and thyroid normal to inspection and palpation Lungs: clear to auscultation bilaterally Breasts: normal appearance, no masses or tenderness, No nipple retraction or dimpling, No nipple discharge or bleeding, No axillary adenopathy Heart: regular rate and rhythm Abdomen: soft, non-tender; no masses, no organomegaly Extremities: extremities normal, atraumatic, no cyanosis or edema Skin: skin color, texture, turgor normal. No rashes or lesions Lymph nodes: cervical, supraclavicular, and axillary nodes normal. Neurologic: grossly normal  Pelvic: External genitalia:  no lesions              No abnormal inguinal nodes palpated.              Urethra:  normal appearing urethra with no masses, tenderness or lesions              Bartholins and Skenes: normal                 Vagina: normal appearing vagina with normal color and discharge, no lesions  Cervix: no lesions              Pap taken: {yes no:314532} Bimanual Exam:  Uterus:  normal size, contour, position, consistency, mobility, non-tender              Adnexa: no mass, fullness, tenderness              Rectal exam: {yes no:314532}.  Confirms.              Anus:  normal sphincter tone, no lesions  Chaperone was present for exam.  Assessment:   Well woman visit with normal exam.   Plan: Mammogram screening discussed. Self breast awareness reviewed. Pap and HR HPV as above. Guidelines for Calcium, Vitamin D, regular exercise program including cardiovascular and weight bearing exercise.   Follow up annually and prn.   Additional counseling given.  {yes [id number]. _______ minutes face to face time of which over 50% was spent in counseling.    After visit summary provided.

## 2021-05-15 ENCOUNTER — Ambulatory Visit: Payer: Managed Care, Other (non HMO) | Admitting: Obstetrics and Gynecology

## 2021-06-23 ENCOUNTER — Ambulatory Visit: Payer: Managed Care, Other (non HMO) | Admitting: Obstetrics and Gynecology

## 2021-08-28 NOTE — Progress Notes (Signed)
25 y.o. G1P0010 Single Asian female here for annual exam.    Periods irregular and would like birth control to control cycles.  Uses Nuva Ring, Depo Provera, Nexplanon, and pills.   Denies HTN, migraine with aura, liver or breast disease, personal or family history thromboembolic events.   Working as a travel Engineer, civil (consulting) in [place].  Self pay today.  PCP:  None.  Patient's last menstrual period was 08/30/2021.     Period Cycle (Days):  (28-40 days) Period Duration (Days): 2 Period Pattern: (!) Irregular Menstrual Flow: Moderate, Heavy Dysmenorrhea: (!) Moderate Dysmenorrhea Symptoms: Cramping     Sexually active: Yes.    The current method of family planning is condoms  Exercising: Yes.    Gym.  Smoker:  no  Health Maintenance: Pap:  05-12-20 normal History of abnormal Pap:  yes [employer] MMG:  N/A Colonoscopy:  N/A BMD:   N/A  Result   TDaP:  2013 Gardasil:   yes HIV:05-11-19 NR Hep C:05-11-19 NR Screening Labs:  STD screening only today.  Flu vaccine:  completed.    reports that she has never smoked. She has never used smokeless tobacco. She reports current alcohol use. She reports that she does not use drugs.  Past Medical History:  Diagnosis Date   Anemia    Anxiety    Depression    Low mean corpuscular volume (MCV)    normal iron studies   STD (sexually transmitted disease) 08/2019   treated for chlamydia    Past Surgical History:  Procedure Laterality Date   CHALAZION EXCISION      Current Outpatient Medications  Medication Sig Dispense Refill   OVER THE COUNTER MEDICATION Ashwaganda: 1 tablet as needed at night     prazosin (MINIPRESS) 2 MG capsule Take 6 mg by mouth at bedtime.     Vilazodone HCl (VIIBRYD) 40 MG TABS Take 40 mg by mouth daily.     LORazepam (ATIVAN) 0.5 MG tablet Take 1 tablet by mouth as needed. (Patient not taking: Reported on 09/01/2021)     No current facility-administered medications for this visit.    Family History   Problem Relation Age of Onset   Colon cancer Mother    Hypertension Mother    Cancer Mother        colon cancer in her 18s.   Prostate cancer Father    Hypertension Father    Gout Father    Anemia Sister    Epilepsy Brother    Anemia Brother    Hepatitis B Sister    Anemia Sister    Anemia Sister    Anemia Brother     Review of Systems  All other systems reviewed and are negative.  Exam:   BP 116/70 (Cuff Size: Normal)   Pulse 78   Ht 5\' 2"  (1.575 m)   Wt 131 lb (59.4 kg)   LMP 08/30/2021   SpO2 99%   BMI 23.96 kg/m     General appearance: alert, cooperative and appears stated age Head: normocephalic, without obvious abnormality, atraumatic Neck: no adenopathy, supple, symmetrical, trachea midline and thyroid normal to inspection and palpation Lungs: clear to auscultation bilaterally Breasts: normal appearance, no masses or tenderness, No nipple retraction or dimpling, No nipple discharge or bleeding, No axillary adenopathy Heart: regular rate and rhythm Abdomen: soft, non-tender; no masses, no organomegaly Extremities: extremities normal, atraumatic, no cyanosis or edema Skin: skin color, texture, turgor normal. No rashes or lesions Lymph nodes: cervical, supraclavicular, and axillary nodes  normal. Neurologic: grossly normal  Pelvic: External genitalia:  no lesions              No abnormal inguinal nodes palpated.              Urethra:  normal appearing urethra with no masses, tenderness or lesions              Bartholins and Skenes: normal                 Vagina: normal appearing vagina with normal color and discharge, no lesions              Cervix: no lesions.  Menstrual flow. Noted.               Pap taken: no. Bimanual Exam:  Uterus:  normal size, contour, position, consistency, mobility, non-tender              Adnexa: no mass, fullness, tenderness         Chaperone was present for exam:  Malen Gauze., CMA  Assessment:   Well woman visit with gynecologic  exam. Chlamydia in 2020.  ?PID Irregular menses. FH colon cancer in mother in her 4s  Plan: Mammogram screening discussed. Self breast awareness reviewed. Pap and HR HPV as above. Guidelines for Calcium, Vitamin D, regular exercise program including cardiovascular and weight bearing exercise. STD screening.  Rx for Annovera.  Instructed in use. Follow up annually and prn.   After visit summary provided.

## 2021-09-01 ENCOUNTER — Ambulatory Visit (INDEPENDENT_AMBULATORY_CARE_PROVIDER_SITE_OTHER): Payer: Self-pay | Admitting: Obstetrics and Gynecology

## 2021-09-01 ENCOUNTER — Encounter: Payer: Self-pay | Admitting: Obstetrics and Gynecology

## 2021-09-01 ENCOUNTER — Other Ambulatory Visit: Payer: Self-pay

## 2021-09-01 VITALS — BP 116/70 | HR 78 | Ht 62.0 in | Wt 131.0 lb

## 2021-09-01 DIAGNOSIS — Z01419 Encounter for gynecological examination (general) (routine) without abnormal findings: Secondary | ICD-10-CM

## 2021-09-01 DIAGNOSIS — Z113 Encounter for screening for infections with a predominantly sexual mode of transmission: Secondary | ICD-10-CM

## 2021-09-01 MED ORDER — ANNOVERA 0.013-0.15 MG/24HR VA RING: VAGINAL_RING | VAGINAL | 0 refills | Status: AC

## 2021-09-01 NOTE — Patient Instructions (Signed)

## 2021-09-04 LAB — HEPATITIS C ANTIBODY
Hepatitis C Ab: NONREACTIVE
SIGNAL TO CUT-OFF: 0.02 (ref <1.00)

## 2021-09-04 LAB — HEPATITIS B SURFACE ANTIGEN: Hepatitis B Surface Ag: NONREACTIVE

## 2021-09-04 LAB — SURESWAB CT/NG/T. VAGINALIS
C. trachomatis RNA, TMA: NOT DETECTED
N. gonorrhoeae RNA, TMA: NOT DETECTED
Trichomonas vaginalis RNA: NOT DETECTED

## 2021-09-04 LAB — HIV ANTIBODY (ROUTINE TESTING W REFLEX): HIV 1&2 Ab, 4th Generation: NONREACTIVE

## 2021-09-04 LAB — RPR: RPR Ser Ql: NONREACTIVE

## 2024-09-10 ENCOUNTER — Other Ambulatory Visit: Payer: Self-pay | Admitting: Medical Genetics

## 2024-12-15 DIAGNOSIS — Z006 Encounter for examination for normal comparison and control in clinical research program: Secondary | ICD-10-CM
